# Patient Record
Sex: Male | Born: 1980 | Race: White | Hispanic: No | Marital: Married | Smoking: Current every day smoker
Health system: Southern US, Community
[De-identification: ages and names within clinical notes are randomized; demographics above are authoritative.]

## PROBLEM LIST (undated history)

## (undated) HISTORY — PX: EXTERNAL EAR SURGERY: SHX627

---

## 2010-06-16 ENCOUNTER — Emergency Department: Payer: Self-pay | Admitting: Emergency Medicine
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
## 2023-03-03 ENCOUNTER — Ambulatory Visit
Admission: EM | Admit: 2023-03-03 | Discharge: 2023-03-03 | Disposition: A | Discharge status: Home / Self Care | Payer: Self-pay | Attending: Emergency Medicine | Admitting: Emergency Medicine

## 2023-03-03 ENCOUNTER — Ambulatory Visit: Payer: Self-pay

## 2023-03-03 DIAGNOSIS — R739 Hyperglycemia, unspecified: Secondary | ICD-10-CM | POA: Insufficient documentation

## 2023-03-03 DIAGNOSIS — R030 Elevated blood-pressure reading, without diagnosis of hypertension: Secondary | ICD-10-CM | POA: Insufficient documentation

## 2023-03-03 LAB — BASIC METABOLIC PANEL
Anion gap: 7 (ref 5–15)
BUN: 19 mg/dL (ref 6–20)
CO2: 25 mmol/L (ref 22–32)
Calcium: 9 mg/dL (ref 8.9–10.3)
Chloride: 101 mmol/L (ref 98–111)
Creatinine, Ser: 1.11 mg/dL (ref 0.61–1.24)
GFR, Estimated: >60 mL/min (ref >60)
Glucose, Bld: 113 mg/dL — ABNORMAL HIGH (ref 70–99)
Potassium: 4.4 mmol/L (ref 3.5–5.1)
Sodium: 133 mmol/L — ABNORMAL LOW (ref 135–145)

## 2023-03-03 NOTE — ED Triage Notes (Signed)
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   Pt states that he was given a liter of IV fluids on Monday 6.17.24 and felt sick again on 6.18.24 and was given 3L of IV fluid that night.  Pt states that he feels fine today and his mother is worried because he hasn't "bounced back yet".

## 2023-03-03 NOTE — Discharge Instructions (Addendum)
Please check your blood pressure at home periodically.  Make sure that you are not taking it first in the morning or late at night.  You need to be sitting in a relaxed state for at least 30 minutes before taking her blood pressure.  When you do take your blood pressure make sure that your feet are flat on the floor, your bladder is empty, and the arm you are taking the blood pressure reading is at heart level for at least 5 minutes.  Record the readings you get.  He also to check your blood sugar every morning before you eat or drink anything and record this value.  We are going to set you up with a primary care provider at discharge.  Please take your blood pressure and blood sugar readings to this appointment.  If your blood pressure is running higher than 140/90 and your blood sugar is running higher than 100 fasting you may need to get started on medication for blood pressure and diabetes.  If you do have a return of dizziness, weakness, develop nausea or vomiting, or any other concerning symptoms please return for reevaluation.

## 2023-03-03 NOTE — ED Provider Notes (Signed)
[facility]    CSN: [id number] Arrival date & time: 03/03/23  7829      History   Chief Complaint No chief complaint on file.   HPI William Mcknight is a 42 y.o. male.   HPI  42 year old male with no significant past medical history presents with his mother for evaluation after possible heat exhaustion.  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  2 days ago he was baling hay and he began to have a feeling of being unwell and felt very hot and flushed.  He went home and laid in the bed.  He was given a total of 4 L of IV fluid across 2 different days (02/28/2023 and 03/01/2023) and now reports that he feels better.  He was experiencing dizziness and weakness.  He reports that he is drinking fluids and that he is urinating and his urine is clear.  He denies any syncopal event, nausea, vomiting, or headache.  History reviewed. No pertinent past medical history.  There are no problems to display for this patient.   History reviewed. No pertinent surgical history.     Home Medications    Prior to Admission medications   Not on File    Family History History reviewed. No pertinent family history.  Social History Social History   Tobacco Use   Smoking status: Every Day    Types: Cigarettes   Smokeless tobacco: Current  Vaping Use   Vaping Use: Never used  Substance Use Topics   Alcohol use: Yes   Drug use: Never     Allergies   Patient has no known allergies.   Review of Systems Review of Systems  Constitutional:  Positive for fatigue.  Gastrointestinal:  Negative for nausea and vomiting.  Neurological:  Positive for dizziness. Negative for syncope and headaches.     Physical Exam Triage Vital Signs ED Triage Vitals  Enc Vitals Group     BP      Pulse      Resp      Temp      Temp src      SpO2      Weight      Height      Head Circumference      Peak Flow      Pain Score      Pain Loc      Pain  Edu?      Excl. in GC?    No data found.  Updated Vital Signs BP (!) 152/100 (BP Location: Left Arm)   Pulse 85   Temp 98.4 F (36.9 C) (Oral)   Ht 6' (1.829 m)   Wt 200 lb (90.7 kg)   SpO2 97%   BMI 27.12 kg/m   Visual Acuity Right Eye Distance:   Left Eye Distance:   Bilateral Distance:    Right Eye Near:   Left Eye Near:    Bilateral Near:     Physical Exam Vitals and nursing note reviewed.  Constitutional:      Appearance: Normal appearance. He is not ill-appearing.  HENT:     Head: Normocephalic and atraumatic.     Mouth/Throat:     Mouth: Mucous membranes are moist.     Pharynx: Oropharynx is clear. No oropharyngeal exudate or posterior oropharyngeal erythema.  Eyes:     General: No scleral icterus.    Extraocular Movements: Extraocular movements intact.     Conjunctiva/sclera: Conjunctivae normal.  Pupils: Pupils are equal, round, and reactive to light.     Comments: Ocular sclera are bright and shiny.  Cardiovascular:     Rate and Rhythm: Normal rate and regular rhythm.     Pulses: Normal pulses.     Heart sounds: Normal heart sounds. No murmur heard.    No friction rub. No gallop.  Pulmonary:     Effort: Pulmonary effort is normal.     Breath sounds: Normal breath sounds. No wheezing, rhonchi or rales.  Skin:    General: Skin is warm and dry.     Capillary Refill: Capillary refill takes less than 2 seconds.     Findings: No erythema or rash.  Neurological:     General: No focal deficit present.     Mental Status: He is alert and oriented to person, place, and time.  Psychiatric:        Mood and Affect: Mood normal.        Behavior: Behavior normal.        Thought Content: Thought content normal.        Judgment: Judgment normal.      UC Treatments / Results  Labs (all labs ordered are listed, but only abnormal results are displayed) Labs Reviewed  BASIC METABOLIC PANEL - Abnormal; Notable for the following components:      Result Value    Sodium 133 (*)    Glucose, Bld 113 (*)    All other components within normal limits    EKG   Radiology No results found.  Procedures Procedures (including critical care time)  Medications Ordered in UC Medications - No data to display  Initial Impression / Assessment and Plan / UC Course  I have reviewed the triage vital signs and the nursing notes.  Pertinent labs & imaging results that were available during my care of the patient were reviewed by me and considered in my medical decision making (see chart for details).   Patient is a pleasant, nontoxic-appearing 42 year old male who presents for evaluation after possible heat exhaustion earlier in the week.  He was treated at home with IV fluids and reports that all the symptoms have resolved.  His mom is concerned because he has not "bounced back".  Patient is alert and orient x 3 and his or mucous membranes are moist.  Ocular sclera are bright shiny.  Skin turgor is normal.  Heart sounds are S1-S2 with regular rate and rhythm and lung sounds are clear to auscultation all fields.  Patient is hypertensive in clinic with a blood pressure 152/100.  There is a family history of hypertension and his mom is currently on medication.  The patient does not go to the doctor because he does not have insurance.  His mother reports that every time he is at the doctor, most recently was several years ago at the dentist, his blood pressure has been high.  Given his recent bout of heat exhaustion and his elevated BP I will order a BMP to evaluate electrolytes and renal function.  If patient's lab values are normal I will discharge him home and have him monitor his blood pressure as we are unsure if this is physiologic versus psychologic.  I have informed the patient that if his blood pressure runs consistently greater than 140/90 that he will need to be on some medication for high blood pressure.  BMP shows mild hyponatremia with sodium 133 and  hyperglycemia with a sugar of 113.  Potassium, chloride, renal function  are all normal.  Patient states that he is fasting only had this morning with a cup of water.  His father did have diabetes.  He does have ability to check his blood sugar at home.  I am going to discharge him home and have him monitor his blood sugar at home as well as his blood pressure.  Will get him set up with a primary care provider at discharge that he can take his readings to and discussed whether or not he needs to start on medication for both.   Final Clinical Impressions(s) / UC Diagnoses   Final diagnoses:  Hyperglycemia  Elevated blood pressure reading     Discharge Instructions      Please check your blood pressure at home periodically.  Make sure that you are not taking it first in the morning or late at night.  You need to be sitting in a relaxed state for at least 30 minutes before taking her blood pressure.  When you do take your blood pressure make sure that your feet are flat on the floor, your bladder is empty, and the arm you are taking the blood pressure reading is at heart level for at least 5 minutes.  Record the readings you get.  He also to check your blood sugar every morning before you eat or drink anything and record this value.  We are going to set you up with a primary care provider at discharge.  Please take your blood pressure and blood sugar readings to this appointment.  If your blood pressure is running higher than 140/90 and your blood sugar is running higher than 100 fasting you may need to get started on medication for blood pressure and diabetes.  If you do have a return of dizziness, weakness, develop nausea or vomiting, or any other concerning symptoms please return for reevaluation.     ED Prescriptions   None    PDMP not reviewed this encounter.   Becky Augusta, NP 03/03/23 [phone]

## 2023-04-06 ENCOUNTER — Ambulatory Visit: Payer: Self-pay | Admitting: Physician Assistant

## 2024-05-03 ENCOUNTER — Ambulatory Visit (INDEPENDENT_AMBULATORY_CARE_PROVIDER_SITE_OTHER): Payer: Self-pay

## 2024-05-03 VITALS — BP 150/90 | HR 76 | Ht 72.0 in | Wt 193.0 lb

## 2024-05-03 DIAGNOSIS — F1090 Alcohol use, unspecified, uncomplicated: Secondary | ICD-10-CM | POA: Insufficient documentation

## 2024-05-03 DIAGNOSIS — Z125 Encounter for screening for malignant neoplasm of prostate: Secondary | ICD-10-CM

## 2024-05-03 DIAGNOSIS — F1010 Alcohol abuse, uncomplicated: Secondary | ICD-10-CM

## 2024-05-03 DIAGNOSIS — I159 Secondary hypertension, unspecified: Secondary | ICD-10-CM

## 2024-05-03 DIAGNOSIS — I10 Essential (primary) hypertension: Secondary | ICD-10-CM | POA: Insufficient documentation

## 2024-05-03 LAB — URINALYSIS, ROUTINE W REFLEX MICROSCOPIC
Bilirubin Urine: NEGATIVE
Glucose, UA: NEGATIVE
Hgb urine dipstick: NEGATIVE
Ketones, ur: NEGATIVE
Leukocytes,Ua: NEGATIVE
Nitrite: NEGATIVE
Protein, ur: NEGATIVE
Specific Gravity, Urine: 1.006 (ref 1.001–1.035)
pH: 7.5 (ref 5.0–8.0)

## 2024-05-03 NOTE — Progress Notes (Signed)
 New Patient Visit   Physician: Chivon Lepage A Arnette Driggs, MD  Patient: William Mcknight   DOB: [birth year]   43 y.o. Male  MRN: [id number] Visit Date: 05/03/2024   Chief Complaint  Patient presents with   Establish Care    Concerned about diabetes and high blood pressure.   Subjective  William Mcknight is a 43 y.o. male who presents today as a new patient to establish care.   HPI  Discussed the use of AI scribe software for clinical note transcription with the patient, who gave verbal consent to proceed.  History of Present Illness    Dehydration and dizziness - Two episodes of dehydration and dizziness: once last summer and again three to four weeks ago - First episode occurred in the morning without significant heat exposure - Most recent episode occurred after skipping breakfast - Symptoms attributed to sun exposure - No unusual thirst or polyuria  Hyperglycemia - Elevated blood glucose levels during recent emergency department visit - Home blood glucose monitoring shows typical readings of 127 to 160-170 mg/dL, never exceeding 799 mg/dL  Alcohol use - Consumes 12 to 18 beers daily - Abstained from alcohol for two years, resumed drinking approximately two years ago - Difficulty sleeping when attempting to quit alcohol - Occasional anxiety associated with attempts to quit drinking  - Spouse drinks with him  Hypertension - BP today 150/90  Tobacco use - Smokes approximately one pack of cigarettes daily - No cough or dyspnea - Feels well despite elevated heart rate  Dietary habits - Diet includes could be improved  Sleep disturbance and anxiety - Occasional anxiety - No depression  Cardiovascular risk factors - No recent blood work, including cholesterol levels - Uncertain of usual blood pressure readings - Family history of hypertension  Sexual health - No history of sexually transmitted diseases - No current symptoms related to sexually transmitted diseases      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   Prostate cancer screening Yes   Alcohol abuse    Secondary hypertension    Alcohol use disorder     Orders Placed This Encounter  Procedures   CBC with Differential/Platelet   Comprehensive metabolic panel with GFR   Hemoglobin A1c   Lipid panel   Urinalysis, Routine w reflex microscopic   PSA    Assessment and Plan    Alcohol use disorder Chronic alcohol use disorder with consumption of 12 to 18 beers daily.  He is interested in quitting but experiences sleep disturbances during withdrawal. Significant health risks include increased risk of cancer, liver damage, and cardiovascular issues. Potential for anxiety and depression during withdrawal was discussed. - Discuss potential use of medications to aid in alcohol cessation if desired - Encourage participation in support groups such as AA - Recommend taking a B complex vitamin to counteract potential nutritional deficiencies due to alcohol use - Advise against simultaneous cessation of alcohol and tobacco to prevent overwhelming withdrawal symptoms  Tobacco use disorder Chronic tobacco use with smoking approximately one pack per day. Long-term health risks include increased risk of lung cancer and cardiovascular disease. Smoking cessation should be considered after addressing alcohol use disorder to avoid overwhelming withdrawal symptoms. - Advise smoking cessation after addressing alcohol use disorder - Discuss potential for low-dose CT screening for lung cancer at age 64 if smoking continues  Hypertension Elevated blood pressure recorded at 150/90 mmHg. No regular home blood  pressure monitoring. Risks of uncontrolled hypertension include cardiovascular disease and stroke. - Provide a blood pressure log for home monitoring - Instruct to record 10 blood pressure values at home, ensuring relaxation before measurement - Schedule  follow-up appointment for the first week of October to review blood pressure log and lab results  Follow-Up Follow-up is necessary to monitor blood pressure and review lab results. - Schedule follow-up appointment for the first week of October - Instruct to complete blood work prior to the follow-up appointment - Advise to contact the office if any issues arise before the scheduled follow-up      The patient is at high risk for morbidity and mortality due to substance use disorder.  We discussed compliance in the patient at least make an effort towards my recommendations.  I will try and help him with ongoing chronic issues is much as possible.  Patient will follow-up first week of October with blood pressure log and lab results.       Objective  BP (!) 150/90 (BP Location: Left Arm, Patient Position: Sitting, Cuff Size: Large)   Pulse 76   Ht 6' (1.829 m)   Wt 193 lb (87.5 kg)   SpO2 99%   BMI 26.18 kg/m      Review of Systems  Constitutional:  Negative for chills, fever and weight loss.  Eyes:  Negative for blurred vision. h Respiratory:  Negative for cough and shortness of breath.   Cardiovascular:  Negative for chest pain and palpitations.  Skin:  Negative for rash.  Psychiatric/Behavioral:  Negative for depression. The patient is not nervous/anxious.      Physical Exam Physical Exam Vitals reviewed.  Constitutional:      Appearance: Normal appearance. Well-developed with normal weight.  HENT:     Head: Normocephalic and atraumatic.  Normal mucous membranes, no oral lesions Eyes:     Pupils: Pupils are equal, round, and reactive to light.  Neck:     Thyroid: No thyroid mass or thyromegaly.  Cardiovascular:     Rate and Rhythm: Normal rate and regular rhythm. Normal heart sounds. Normal peripheral pulses Pulmonary:     Normal breath sounds with normal effort Abdominal:   Abdomen is soft, without tenderness or noted hepatosplenomegaly Musculoskeletal:         General: No swelling or edema  Lymphadenopathy:     Cervical: No cervical adenopathy.  Skin:    General: Skin is warm and dry without noticeable rash. Neurological:     General: No focal deficit present.  Psychiatric:        Mood and Affect: Mood, behavior and cognition normal   History reviewed. No pertinent past medical history. Past Surgical History:  Procedure Laterality Date   EXTERNAL EAR SURGERY Right    Family Status  Relation Name Status   Mother  Alive   Father  Deceased   Sister  Alive   Daughter  Alive   MGM  Deceased   MGF  Deceased   PGM  Deceased   PGF  Deceased  No partnership data on file   Family History  Problem Relation Age of Onset   Healthy Mother    Cancer Father    Healthy Sister    Healthy Daughter    Social History   Socioeconomic History   Marital status: Married    Spouse name: Not on file   Number of children: Not on file   Years of education: Not on file   Highest education level: Not  on file  Occupational History   Not on file  Tobacco Use   Smoking status: Every Day    Types: Cigarettes   Smokeless tobacco: Current  Vaping Use   Vaping status: Never Used  Substance and Sexual Activity   Alcohol use: Yes   Drug use: Never   Sexual activity: Yes  Other Topics Concern   Not on file  Social History Narrative   Not on file   Social Drivers of Health   Financial Resource Strain: Not on file  Food Insecurity: Not on file  Transportation Needs: Not on file  Physical Activity: Not on file  Stress: Not on file  Social Connections: Not on file   No outpatient medications prior to visit.   No facility-administered medications prior to visit.   No Known Allergies   There is no immunization history on file for this patient.  Health Maintenance  Topic Date Due   HIV Screening  Never done   Hepatitis C Screening  Never done   DTaP/Tdap/Td (1 - Tdap) Never done   Pneumococcal Vaccine (1 of 2 - PCV) Never done    Hepatitis B Vaccines 19-59 Average Risk (1 of 3 - 19+ 3-dose series) Never done   HPV VACCINES (1 - 3-dose SCDM series) Never done   COVID-19 Vaccine (1 - 2024-25 season) Never done   INFLUENZA VACCINE  04/13/2024   Meningococcal B Vaccine  Aged Out    Patient Care Team: Everlene Parris LABOR, MD as PCP - General (Family Medicine)  Depression Screen    05/03/2024    8:13 AM  PHQ 2/9 Scores  PHQ - 2 Score 0  PHQ- 9 Score 1     Parris LABOR Everlene, MD  [facility] [phone] (phone) [phone] (fax)  [facility]

## 2024-05-04 LAB — COMPREHENSIVE METABOLIC PANEL WITH GFR
AG Ratio: 1.7 (calc) (ref 1.0–2.5)
ALT: 10 U/L (ref 9–46)
AST: 15 U/L (ref 10–40)
Albumin: 4.7 g/dL (ref 3.6–5.1)
Alkaline phosphatase (APISO): 61 U/L (ref 36–130)
BUN: 14 mg/dL (ref 7–25)
CO2: 28 mmol/L (ref 20–32)
Calcium: 9.2 mg/dL (ref 8.6–10.3)
Chloride: 102 mmol/L (ref 98–110)
Creat: 1.12 mg/dL (ref 0.60–1.29)
Globulin: 2.7 g/dL (ref 1.9–3.7)
Glucose, Bld: 122 mg/dL — ABNORMAL HIGH (ref 65–99)
Potassium: 4.7 mmol/L (ref 3.5–5.3)
Sodium: 137 mmol/L (ref 135–146)
Total Bilirubin: 0.5 mg/dL (ref 0.2–1.2)
Total Protein: 7.4 g/dL (ref 6.1–8.1)
eGFR: 83 mL/min/1.73m2 (ref >=60)

## 2024-05-04 LAB — CBC WITH DIFFERENTIAL/PLATELET
Absolute Lymphocytes: 1617 {cells}/uL (ref 850–3900)
Absolute Monocytes: 561 {cells}/uL (ref 200–950)
Basophils Absolute: 73 {cells}/uL (ref 0–200)
Basophils Relative: 1.1 %
Eosinophils Absolute: 409 {cells}/uL (ref 15–500)
Eosinophils Relative: 6.2 %
HCT: 49.1 % (ref 38.5–50.0)
Hemoglobin: 16.3 g/dL (ref 13.2–17.1)
MCH: 32.1 pg (ref 27.0–33.0)
MCHC: 33.2 g/dL (ref 32.0–36.0)
MCV: 96.7 fL (ref 80.0–100.0)
MPV: 9.8 fL (ref 7.5–12.5)
Monocytes Relative: 8.5 %
Neutro Abs: 3940 {cells}/uL (ref 1500–7800)
Neutrophils Relative %: 59.7 %
Platelets: 183 Thousand/uL (ref 140–400)
RBC: 5.08 Million/uL (ref 4.20–5.80)
RDW: 12 % (ref 11.0–15.0)
Total Lymphocyte: 24.5 %
WBC: 6.6 Thousand/uL (ref 3.8–10.8)

## 2024-05-04 LAB — LIPID PANEL
Cholesterol: 208 mg/dL — ABNORMAL HIGH (ref <200)
HDL: 50 mg/dL (ref >=40)
LDL Cholesterol (Calc): 136 mg/dL — ABNORMAL HIGH
Non-HDL Cholesterol (Calc): 158 mg/dL — ABNORMAL HIGH (ref <130)
Total CHOL/HDL Ratio: 4.2 (calc) (ref <5.0)
Triglycerides: 113 mg/dL (ref <150)

## 2024-05-04 LAB — HEMOGLOBIN A1C
Hgb A1c MFr Bld: 5.6 % (ref <5.7)
Mean Plasma Glucose: 114 mg/dL
eAG (mmol/L): 6.3 mmol/L

## 2024-05-04 LAB — PSA: PSA: 0.65 ng/mL (ref <=4.00)

## 2024-06-18 ENCOUNTER — Ambulatory Visit (INDEPENDENT_AMBULATORY_CARE_PROVIDER_SITE_OTHER): Payer: Self-pay

## 2024-06-18 ENCOUNTER — Ambulatory Visit: Payer: Self-pay

## 2024-06-18 VITALS — BP 158/108 | HR 82 | Ht 72.0 in | Wt 195.8 lb

## 2024-06-18 DIAGNOSIS — M25522 Pain in left elbow: Secondary | ICD-10-CM

## 2024-06-18 DIAGNOSIS — M25529 Pain in unspecified elbow: Secondary | ICD-10-CM | POA: Insufficient documentation

## 2024-06-18 DIAGNOSIS — F1090 Alcohol use, unspecified, uncomplicated: Secondary | ICD-10-CM

## 2024-06-18 DIAGNOSIS — I10 Essential (primary) hypertension: Secondary | ICD-10-CM

## 2024-06-18 DIAGNOSIS — F17200 Nicotine dependence, unspecified, uncomplicated: Secondary | ICD-10-CM

## 2024-06-18 MED ORDER — LOSARTAN POTASSIUM 50 MG PO TABS: 50.0000 mg | ORAL_TABLET | Freq: Every day | ORAL | 1 refills | Status: AC

## 2024-06-18 NOTE — Progress Notes (Addendum)
 Progress Note  Physician: Alianny Toelle A Garlin Batdorf, MD   HPI: William Mcknight is a 43 y.o. male presenting on 06/18/2024 for Follow-up (Patient states he is  having  left elbow pain. Sates the pain has been there for 1 year.) .  Discussed the use of AI scribe software for clinical note transcription with the patient, who gave verbal consent to proceed.  History of Present Illness   William Mcknight is a 43 year old male who presents for management of hypertension and elbow pain.  Hypertension - Consistently elevated home blood pressure readings ranging from 150-160 mmHg systolic - No prior use of antihypertensive medications - No shortness of breath or cough - No family history of heart disease or stroke  Lateral elbow pain - Elbow pain present for approximately one year - Pain localized to the left medial elbow, worsened by pronating movements, radiation to forearm, no numbness - Use of an elbow wrap provides some relief - Pain interfering in work in carpentry  Tobacco and alcohol use - Consumes six to seven alcoholic drinks per day, reduced from previous levels - Previously abstained from alcohol for two years, now resumed daily consumption - Desires to limit alcohol intake to weekends but finds it challenging - Smokes regularly, with smoking associated with drinking - No recent attempts to quit smoking      I personally reviewed and interpreted the patient labs today.  Recent Results (from the past 2160 hours)  CBC with Differential/Platelet     Status: None   Collection Time: 05/03/24  8:52 AM  Result Value Ref Range   WBC 6.6 3.8 - 10.8 Thousand/uL   RBC 5.08 4.20 - 5.80 Million/uL   Hemoglobin 16.3 13.2 - 17.1 g/dL   HCT 50.8 61.4 - 49.9 %   MCV 96.7 80.0 - 100.0 fL   MCH 32.1 27.0 - 33.0 pg   MCHC 33.2 32.0 - 36.0 g/dL    Comment: For adults, a slight decrease in the calculated MCHC value (in the range of 30 to 32 g/dL) is most likely not clinically  significant; however, it should be interpreted with caution in correlation with other red cell parameters and the patient's clinical condition.    RDW 12.0 11.0 - 15.0 %   Platelets 183 140 - 400 Thousand/uL   MPV 9.8 7.5 - 12.5 fL   Neutro Abs 3,940 1,500 - 7,800 cells/uL   Absolute Lymphocytes 1,617 850 - 3,900 cells/uL   Absolute Monocytes 561 200 - 950 cells/uL   Eosinophils Absolute 409 15 - 500 cells/uL   Basophils Absolute 73 0 - 200 cells/uL   Neutrophils Relative % 59.7 %   Total Lymphocyte 24.5 %   Monocytes Relative 8.5 %   Eosinophils Relative 6.2 %   Basophils Relative 1.1 %  Comprehensive metabolic panel with GFR     Status: Abnormal   Collection Time: 05/03/24  8:52 AM  Result Value Ref Range   Glucose, Bld 122 (H) 65 - 99 mg/dL    Comment: .            Fasting reference interval . For someone without known diabetes, a glucose value between 100 and 125 mg/dL is consistent with prediabetes and should be confirmed with a follow-up test. .    BUN 14 7 - 25 mg/dL   Creat 8.87 9.39 - 8.70 mg/dL   eGFR 83 > OR = 60 fO/fpw/8.26f7   BUN/Creatinine Ratio  SEE NOTE: 6 - 22 (calc)    Comment:    Not Reported: BUN and Creatinine are within    reference range. .    Sodium 137 135 - 146 mmol/L   Potassium 4.7 3.5 - 5.3 mmol/L   Chloride 102 98 - 110 mmol/L   CO2 28 20 - 32 mmol/L   Calcium 9.2 8.6 - 10.3 mg/dL   Total Protein 7.4 6.1 - 8.1 g/dL   Albumin 4.7 3.6 - 5.1 g/dL   Globulin 2.7 1.9 - 3.7 g/dL (calc)   AG Ratio 1.7 1.0 - 2.5 (calc)   Total Bilirubin 0.5 0.2 - 1.2 mg/dL   Alkaline phosphatase (APISO) 61 36 - 130 U/L   AST 15 10 - 40 U/L   ALT 10 9 - 46 U/L  Hemoglobin A1c     Status: None   Collection Time: 05/03/24  8:52 AM  Result Value Ref Range   Hgb A1c MFr Bld 5.6 <5.7 %    Comment: For the purpose of screening for the presence of diabetes: . <5.7%       Consistent with the absence of diabetes 5.7-6.4%    Consistent with increased risk for  diabetes             (prediabetes) > or =6.5%  Consistent with diabetes . This assay result is consistent with a decreased risk of diabetes. . Currently, no consensus exists regarding use of hemoglobin A1c for diagnosis of diabetes in children. . According to American Diabetes Association (ADA) guidelines, hemoglobin A1c <7.0% represents optimal control in non-pregnant diabetic patients. Different metrics may apply to specific patient populations.  Standards of Medical Care in Diabetes(ADA). .    Mean Plasma Glucose 114 mg/dL   eAG (mmol/L) 6.3 mmol/L  Lipid panel     Status: Abnormal   Collection Time: 05/03/24  8:52 AM  Result Value Ref Range   Cholesterol 208 (H) <200 mg/dL   HDL 50 > OR = 40 mg/dL   Triglycerides 886 <849 mg/dL   LDL Cholesterol (Calc) 136 (H) mg/dL (calc)    Comment: Reference range: <100 . Desirable range <100 mg/dL for primary prevention;   <70 mg/dL for patients with CHD or diabetic patients  with > or = 2 CHD risk factors. SABRA LDL-C is now calculated using the Martin-Hopkins  calculation, which is a validated novel method providing  better accuracy than the Friedewald equation in the  estimation of LDL-C.  Gladis APPLETHWAITE et al. SANDREA. 7986;689(80): 2061-2068  (http://education.[facility].com/faq/FAQ164)    Total CHOL/HDL Ratio 4.2 <5.0 (calc)   Non-HDL Cholesterol (Calc) 158 (H) <130 mg/dL (calc)    Comment: For patients with diabetes plus 1 major ASCVD risk  factor, treating to a non-HDL-C goal of <100 mg/dL  (LDL-C of <29 mg/dL) is considered a therapeutic  option.   PSA     Status: None   Collection Time: 05/03/24  8:52 AM  Result Value Ref Range   PSA 0.65 < OR = 4.00 ng/mL    Comment: The total PSA value from this assay system is  standardized against the WHO standard. The test  result will be approximately 20% lower when compared  to the equimolar-standardized total PSA (Beckman  Coulter). Comparison of serial PSA results should be   interpreted with this fact in mind. . This test was performed using the Siemens  chemiluminescent method. Values obtained from  different assay methods cannot be used interchangeably. PSA levels, regardless of value, should not be interpreted  as absolute evidence of the presence or absence of disease.   Urinalysis, Routine w reflex microscopic     Status: Abnormal   Collection Time: 05/03/24  4:03 PM  Result Value Ref Range   Color, Urine YELLOW YELLOW   APPearance CLOUDY (A) CLEAR   Specific Gravity, Urine 1.006 1.001 - 1.035   pH 7.5 5.0 - 8.0   Glucose, UA NEGATIVE NEGATIVE   Bilirubin Urine NEGATIVE NEGATIVE   Ketones, ur NEGATIVE NEGATIVE   Hgb urine dipstick NEGATIVE NEGATIVE   Protein, ur NEGATIVE NEGATIVE   Nitrite NEGATIVE NEGATIVE   Leukocytes,Ua NEGATIVE NEGATIVE       Medical history:  Relevant past medical, surgical, family and social history reviewed and updated as indicated. Interim medical history since our last visit reviewed.  Allergies and medications reviewed and updated.   ROS: Negative unless specifically indicated above in HPI.    Current Outpatient Medications:    losartan (COZAAR) 50 MG tablet, Take 1 tablet (50 mg total) by mouth daily., Disp: 90 tablet, Rfl: 1       Objective:     BP (!) 158/108   Pulse 82   Ht 6' (1.829 m)   Wt 195 lb 12.8 oz (88.8 kg)   SpO2 98%   BMI 26.56 kg/m   Wt Readings from Last 3 Encounters:  06/18/24 195 lb 12.8 oz (88.8 kg)  05/03/24 193 lb (87.5 kg)  03/03/23 200 lb (90.7 kg)    Physical Exam  Physical Exam Vitals reviewed.  Constitutional:      Appearance: Normal appearance. Well-developed with normal weight.  Cardiovascular:     Rate and Rhythm: Normal rate and regular rhythm. Normal heart sounds. Normal peripheral pulses Pulmonary:     Normal breath sounds with normal effort Skin:    General: Skin is warm and dry without noticeable rash. Neurological:     General: No focal deficit  present.  Psychiatric:        Mood and Affect: Mood, behavior and cognition normal      Assessment & Plan:   Encounter Diagnoses  Name Primary?   Hypertension, unspecified type Yes   Smoker    Alcohol use disorder    Left elbow pain     No orders of the defined types were placed in this encounter.    Assessment and Plan    Hypertension Blood pressure consistently high at home, 150-160 mmHg. Risk of vascular damage and cardiovascular events. No prior antihypertensive use. - Prescribed losartan, sent prescription to pharmacy. - Instructed to check blood pressure regularly at home. - Advised to report dizziness or lightheadedness. - Scheduled follow-up in three weeks to assess blood pressure control.  Alcohol use disorder Daily consumption of six to seven drinks. Increased cardiovascular and liver damage risk. - Discussed risks of continued alcohol use and potential withdrawal symptoms. - Offered assistance with quitting, including medication support if desired.   Tobacco use Long-term smoker, over a pack per day. Increased cardiovascular and lung disease risk. - Advised to reduce smoking to less than a pack per day. - Offered assistance with quitting, including nicotine replacement therapy or medications.  Hyperlipidemia Elevated cholesterol levels, not warranting medication. No family history of heart disease or stroke. - Advised dietary modifications to reduce cholesterol, particularly reducing alcohol and fatty foods. - Monitor cholesterol levels periodically.  - High risk CAD due to lifestyle  Chronic right elbow pain Chronic pain for a year, exacerbated by twisting. Medial epicondylitis.  No neuropathic symptoms -  Provided exercises and physical therapy instructions. - Advised consistent exercise for 3-4 weeks.      Consider low dose CT screening given smoking history, colonoscopy screening next visit to discuss He declines influenza vaccine

## 2024-07-11 ENCOUNTER — Ambulatory Visit (INDEPENDENT_AMBULATORY_CARE_PROVIDER_SITE_OTHER): Payer: Self-pay

## 2024-07-11 VITALS — BP 127/84 | HR 85 | Ht 72.0 in | Wt 199.4 lb

## 2024-07-11 DIAGNOSIS — I10 Essential (primary) hypertension: Secondary | ICD-10-CM

## 2024-07-11 DIAGNOSIS — F1090 Alcohol use, unspecified, uncomplicated: Secondary | ICD-10-CM

## 2024-07-11 DIAGNOSIS — F17200 Nicotine dependence, unspecified, uncomplicated: Secondary | ICD-10-CM

## 2024-07-11 NOTE — Progress Notes (Signed)
            Progress Note  Physician: Branon Sabine A Amilcar Reever, MD   HPI: William Mcknight is a 43 y.o. male presenting on 07/11/2024 for Follow-up .  Discussed the use of AI scribe software for clinical note transcription with the patient, who gave verbal consent to proceed.  History of Present Illness   William Mcknight is a 43 year old male with hypertension who presents for blood pressure management.  Hypertension - Blood pressure monitored at home with values ranging from 120/80 mmHg to 135/85 mmHg - Currently taking losartan 50 mg daily - Previous blood pressure readings were in the 150s, now improved to the 120-130's systolic. BP log reviewed - No side effects from antihypertensive therapy  Tobacco use and associated symptoms - Significant smoking history since age 69 - Currently smokes - No shortness of breath, including with exertion  Lower extremity symptoms with exertion - Legs feel tired and 'burn' after physical activity, such as laying flooring at home - No cramping or pain while walking long distances  Alcohol use - Daily alcohol consumption     Medical history:  Relevant past medical, surgical, family and social history reviewed and updated as indicated. Interim medical history since our last visit reviewed.  Allergies and medications reviewed and updated.   ROS: Negative unless specifically indicated above in HPI.    Current Outpatient Medications:    losartan (COZAAR) 50 MG tablet, Take 1 tablet (50 mg total) by mouth daily., Disp: 90 tablet, Rfl: 1       Objective:     BP 127/84 (Cuff Size: Normal)   Pulse 85   Ht 6' (1.829 m)   Wt 199 lb 6.4 oz (90.4 kg)   SpO2 98%   BMI 27.04 kg/m   Wt Readings from Last 3 Encounters:  07/11/24 199 lb 6.4 oz (90.4 kg)  06/18/24 195 lb 12.8 oz (88.8 kg)  05/03/24 193 lb (87.5 kg)    Physical Exam  Physical Exam Vitals reviewed.  Constitutional:      Appearance: Normal appearance. Well-developed with  normal weight.  Skin:    General: Skin is warm and dry without noticeable rash. Neurological:     General: No focal deficit present.  Psychiatric:        Mood and Affect: Mood, behavior and cognition normal      Assessment & Plan:   Encounter Diagnoses  Name Primary?   Hypertension, unspecified type Yes   Smoker    Alcohol use disorder     No orders of the defined types were placed in this encounter.    Assessment and Plan    Essential hypertension Blood pressure improved with losartan 50 mg, no side effects. Control adequate, no dose increase needed.  Average BP 130/75 - Continue losartan 50 mg daily. - Follow up in April to reassess blood pressure and overall status.  Tobacco use Long-term smoker, no current symptoms.  - Monitor for symptoms such as shortness of breath or leg pain during exertion. - Declines screening CT for lung cancer and COPD today  Alcohol use Discussed cardiovascular risks and advised reduction in alcohol consumption to prevent complications. - Advise reduction in alcohol consumption. - Discuss risks of heavy drinking at each visit.    F/u otherwise as needed.

## 2025-01-07 ENCOUNTER — Ambulatory Visit: Payer: Self-pay
